# Patient Record
Sex: Male | Born: 1970 | Race: White | Hispanic: No | Marital: Single | Smoking: Never smoker
Health system: Southern US, Community
[De-identification: ages and names within clinical notes are randomized; demographics above are authoritative.]

## PROBLEM LIST (undated history)

## (undated) DIAGNOSIS — E785 Hyperlipidemia, unspecified: Secondary | ICD-10-CM

## (undated) DIAGNOSIS — I10 Essential (primary) hypertension: Secondary | ICD-10-CM

## (undated) DIAGNOSIS — J80 Acute respiratory distress syndrome: Secondary | ICD-10-CM

## (undated) HISTORY — PX: ELBOW SURGERY: SHX618

## (undated) HISTORY — PX: WRIST SURGERY: SHX841

---

## 2014-03-26 ENCOUNTER — Ambulatory Visit: Payer: Self-pay | Admitting: Physician Assistant

## 2015-01-13 ENCOUNTER — Ambulatory Visit
Admission: EM | Admit: 2015-01-13 | Discharge: 2015-01-13 | Disposition: A | Discharge status: Home / Self Care | Payer: BLUE CROSS/BLUE SHIELD | Attending: Family Medicine | Admitting: Family Medicine

## 2015-01-13 DIAGNOSIS — L03115 Cellulitis of right lower limb: Secondary | ICD-10-CM

## 2015-01-13 DIAGNOSIS — M10072 Idiopathic gout, left ankle and foot: Secondary | ICD-10-CM | POA: Diagnosis not present

## 2015-01-13 MED ORDER — SULFAMETHOXAZOLE-TRIMETHOPRIM 800-160 MG PO TABS: 1.0000 | ORAL_TABLET | Freq: Two times a day (BID) | ORAL | Status: DC

## 2015-01-13 MED ORDER — HYDROCODONE-ACETAMINOPHEN 5-325 MG PO TABS: 1.0000 | ORAL_TABLET | Freq: Four times a day (QID) | ORAL | Status: DC | PRN

## 2015-01-13 MED ORDER — PREDNISONE 20 MG PO TABS: ORAL_TABLET | ORAL | Status: DC

## 2015-01-13 NOTE — ED Notes (Signed)
Started in the middle of last night with pain left great toe. + redness and swelling, gout like in appearance. Also 3 tick bites right knee last week-now infected

## 2015-01-13 NOTE — ED Provider Notes (Addendum)
CSN: [id number]     Arrival date & time 01/13/15  1537 History   First MD Initiated Contact with Patient 01/13/15 1556     Chief Complaint  Patient presents with  . Toe Pain   (Consider location/radiation/quality/duration/timing/severity/associated sxs/prior Treatment) HPI Comments: 44 yo male with a c/o left big toe pain, redness and swelling that started last night and worse today. Denies any injuries, trauma, fevers, chills, drainage. Also complains of 3 tick bites on his right lower thigh. States the ticks were embedded less than 24 hours, were not engorged, and he pulled them out. The surrounding skin however has become red and slightly tender.   The history is provided by the patient.    History reviewed. No pertinent past medical history. History reviewed. No pertinent past surgical history. Family History  Problem Relation Age of Onset  . Parkinson's disease Mother   . Diabetes Father    History  Substance Use Topics  . Smoking status: Never Smoker   . Smokeless tobacco: Former NeurosurgeonUser    Types: Chew  . Alcohol Use: Yes     Comment: rarely    Review of Systems  Allergies  Review of patient's allergies indicates no known allergies.  Home Medications   Prior to Admission medications   Medication Sig Start Date End Date Taking? Authorizing Provider  HYDROcodone-acetaminophen (NORCO/VICODIN) 5-325 MG per tablet Take 1-2 tablets by mouth every 6 (six) hours as needed. 01/13/15   Payton Mccallumrlando Oakleigh Hesketh, MD  predniSONE (DELTASONE) 20 MG tablet Take 2 tablets po today, then 1 tab po qd for next 6 days 01/13/15   Payton Mccallumrlando Codie Hainer, MD  sulfamethoxazole-trimethoprim (BACTRIM DS,SEPTRA DS) 800-160 MG per tablet Take 1 tablet by mouth 2 (two) times daily. 01/13/15   Payton Mccallumrlando Mistina Coatney, MD   BP 151/111 mmHg  Pulse 100  Temp(Src) 97.5 F (36.4 C) (Oral)  Resp 18  Ht 6\' 1"  (1.854 m)  Wt 300 lb (136.079 kg)  BMI 39.59 kg/m2  SpO2 99% Physical Exam  Constitutional: He appears well-developed and  well-nourished. No distress.  Musculoskeletal: He exhibits edema and tenderness.  Left 1st MTP joint erythematous, swollen and tender; left lower ext neurovascularly intact  Skin: He is not diaphoretic. There is erythema.  3 discreet 2cm skin ulcerations with surrounding blanchable erythema and warmth on the right upper thigh;  no drainage  Nursing note and vitals reviewed.   ED Course  Procedures (including critical care time) Labs Review Labs Reviewed - No data to display  Imaging Review No results found.   MDM   1. Acute idiopathic gout of left foot   2. Cellulitis of right leg    Discharge Medication List as of 01/13/2015  4:14 PM    START taking these medications   Details  HYDROcodone-acetaminophen (NORCO/VICODIN) 5-325 MG per tablet Take 1-2 tablets by mouth every 6 (six) hours as needed., Starting 01/13/2015, Until Discontinued, Print    predniSONE (DELTASONE) 20 MG tablet Take 2 tablets po today, then 1 tab po qd for next 6 days, Normal    sulfamethoxazole-trimethoprim (BACTRIM DS,SEPTRA DS) 800-160 MG per tablet Take 1 tablet by mouth 2 (two) times daily., Starting 01/13/2015, Until Discontinued, Normal      Plan: 1. diagnosis reviewed with patient 2. rx as per orders; risks, benefits, potential side effects reviewed with patient 3. Recommend supportive treatment with rest, warm compresses 4. F/u prn if symptoms worsen or don't improve    Payton Mccallumrlando Jode Lippe, MD 01/13/15 1643  Payton Mccallumrlando Annel Zunker, MD 01/13/15 [phone]

## 2019-02-27 ENCOUNTER — Encounter: Payer: Self-pay | Admitting: Emergency Medicine

## 2019-02-27 ENCOUNTER — Ambulatory Visit (INDEPENDENT_AMBULATORY_CARE_PROVIDER_SITE_OTHER): Payer: BC Managed Care – PPO

## 2019-02-27 ENCOUNTER — Ambulatory Visit
Admission: EM | Admit: 2019-02-27 | Discharge: 2019-02-27 | Disposition: A | Discharge status: Home / Self Care | Payer: BC Managed Care – PPO

## 2019-02-27 ENCOUNTER — Other Ambulatory Visit: Payer: Self-pay

## 2019-02-27 DIAGNOSIS — M25571 Pain in right ankle and joints of right foot: Secondary | ICD-10-CM | POA: Diagnosis not present

## 2019-02-27 DIAGNOSIS — S93491A Sprain of other ligament of right ankle, initial encounter: Secondary | ICD-10-CM

## 2019-02-27 DIAGNOSIS — W108XXA Fall (on) (from) other stairs and steps, initial encounter: Secondary | ICD-10-CM

## 2019-02-27 HISTORY — DX: Acute respiratory distress syndrome: J80

## 2019-02-27 HISTORY — DX: Hyperlipidemia, unspecified: E78.5

## 2019-02-27 HISTORY — DX: Essential (primary) hypertension: I10

## 2019-02-27 MED ORDER — NAPROXEN 500 MG PO TABS: 500.0000 mg | ORAL_TABLET | Freq: Two times a day (BID) | ORAL | 0 refills | Status: AC | PRN

## 2019-02-27 NOTE — ED Notes (Signed)
Ace wrap applied to right ankle.

## 2019-02-27 NOTE — ED Provider Notes (Signed)
[facility]    CSN: [id number] Arrival date & time: 02/27/19  [id number]     History   Chief Complaint Chief Complaint  Patient presents with  . Fall  . Ankle Pain    right    HPI Jared Mathis is a 48 y.o. male.   48 year old male presents with right ankle pain and swelling. He was going down stairs into his garage last evening when he missed a step and fell down a few steps, rolling his ankle. He felt immediate pain and was unable to get up for over 30 minutes. Last night he was unable to climb up the stairs to the 2nd floor to go to bed. He applied ice/cold packs on the ankle and took Advil with minimal relief. Area now is very swollen with small hematoma present on lateral side of his right ankle. He is unable to bear weight. Denies any numbness or tingling. Has sprained his ankle before but "never this severe". Other chronic health issues include HTN, hyperlipidemia, and obesity and currently on Lisinopril, Lipitor, aspirin, Prozac daily and Trazodone prn.   The history is provided by the patient.    Past Medical History:  Diagnosis Date  . ARDS (adult respiratory distress syndrome) (HCC)   . Hyperlipidemia   . Hypertension     There are no active problems to display for this patient.   Past Surgical History:  Procedure Laterality Date  . ELBOW SURGERY Left   . WRIST SURGERY Left        Home Medications    Prior to Admission medications   Medication Sig Start Date End Date Taking? Authorizing Provider  ASPIRIN LOW DOSE 81 MG EC tablet Take 81 mg by mouth daily. 02/12/19  Yes [provider]  atorvastatin (LIPITOR) 10 MG tablet Take 10 mg by mouth daily. 11/09/18  Yes [provider]  FLUoxetine (PROZAC) 20 MG capsule  02/18/19  Yes [provider]  lisinopril (ZESTRIL) 10 MG tablet  02/18/19  Yes [provider]  traZODone (DESYREL) 50 MG tablet TAKE ONE TO TWO TABS DAILY AS NEEDED FOR SLEEP OR ANXIETY SYMPTOMS. 02/12/19   Yes [provider]  naproxen (NAPROSYN) 500 MG tablet Take 1 tablet (500 mg total) by mouth every 12 (twelve) hours as needed for moderate pain. 02/27/19   Sudie GrumblingAmyot, Carnell Casamento Berry, NP    Family History Family History  Problem Relation Age of Onset  . Parkinson's disease Mother   . Diabetes Father     Social History Social History   Tobacco Use  . Smoking status: Never Smoker  . Smokeless tobacco: Former NeurosurgeonUser    Types: Chew  Substance Use Topics  . Alcohol use: Yes    Comment: rarely  . Drug use: Not on file     Allergies   Patient has no known allergies.   Review of Systems Review of Systems  Constitutional: Negative for activity change, appetite change, chills, fatigue and fever.  Respiratory: Negative for cough, chest tightness, shortness of breath and wheezing.   Cardiovascular: Negative for chest pain and leg swelling.  Musculoskeletal: Positive for arthralgias, gait problem, joint swelling and myalgias.  Skin: Positive for color change. Negative for rash and wound.  Allergic/Immunologic: Negative for immunocompromised state.  Neurological: Negative for dizziness, tremors, seizures, syncope, weakness, light-headedness, numbness and headaches.  Hematological: Negative for adenopathy. Does not bruise/bleed easily.  Psychiatric/Behavioral: Positive for sleep disturbance.     Physical Exam Triage Vital Signs ED Triage  Vitals  Enc Vitals Group     BP 02/27/19 0859 118/83     Pulse Rate 02/27/19 0859 79     Resp 02/27/19 0859 18     Temp 02/27/19 0859 98.3 F (36.8 C)     Temp Source 02/27/19 0859 Oral     SpO2 02/27/19 0859 95 %     Weight 02/27/19 0854 (!) 348 lb (157.9 kg)     Height 02/27/19 0854 6\' 1"  (1.854 m)     Head Circumference --      Peak Flow --      Pain Score 02/27/19 0854 10     Pain Loc --      Pain Edu? --      Excl. in [place]? --    No data found.  Updated Vital Signs BP 118/83 (BP Location: Left Arm)   Pulse 79   Temp 98.3 F (36.8  C) (Oral)   Resp 18   Ht 6\' 1"  (1.854 m)   Wt (!) 348 lb (157.9 kg)   SpO2 95%   BMI 45.91 kg/m   Visual Acuity Right Eye Distance:   Left Eye Distance:   Bilateral Distance:    Right Eye Near:   Left Eye Near:    Bilateral Near:     Physical Exam Vitals signs and nursing note reviewed.  Constitutional:      General: He is awake. He is not in acute distress.    Appearance: He is well-developed and well-groomed. He is obese. He is not ill-appearing.     Comments: Patient sitting in wheel chair in no acute distress but appears in pain.   HENT:     Head: Normocephalic and atraumatic.  Eyes:     Extraocular Movements: Extraocular movements intact.     Conjunctiva/sclera: Conjunctivae normal.  Neck:     Musculoskeletal: Normal range of motion.  Cardiovascular:     Rate and Rhythm: Normal rate.  Pulmonary:     Effort: Pulmonary effort is normal.  Musculoskeletal:        General: Swelling, tenderness and signs of injury present.     Right ankle: He exhibits swelling and ecchymosis. He exhibits normal range of motion and normal pulse. Tenderness. Lateral malleolus tenderness found. Achilles tendon normal.       Feet:     Comments: Decreased range of motion of right ankle- particularly with extension and rotation. Very swollen and tender around the entire lateral malleolus. 1 cm small purple hematoma present at surface of lateral malleolus. Some bruising present more posterior. Swelling extends to proximal dorsal area of right foot. Good pulses and capillary refill. No neuro deficits noted. No open wound.  Skin:    General: Skin is warm and dry.     Capillary Refill: Capillary refill takes less than 2 seconds.     Findings: No erythema or rash.  Neurological:     General: No focal deficit present.     Mental Status: He is alert and oriented to person, place, and time.     Sensory: Sensation is intact. No sensory deficit.     Motor: Motor function is intact.     Gait: Gait  normal.     Comments: Unable to bear weight on right foot/ankle  Psychiatric:        Mood and Affect: Mood normal.        Behavior: Behavior normal. Behavior is cooperative.        Thought Content: Thought content normal.  Judgment: Judgment normal.      UC Treatments / Results  Labs (all labs ordered are listed, but only abnormal results are displayed) Labs Reviewed - No data to display  EKG   Radiology Dg Ankle Complete Right  Result Date: 02/27/2019 CLINICAL DATA:  Fall on stairs with ankle pain and swelling, initial encounter EXAM: RIGHT ANKLE - COMPLETE 3+ VIEW COMPARISON:  None. FINDINGS: Generalized soft tissue swelling is noted with a small focal hematoma noted adjacent to the distal fibular metaphysis. No acute fracture or dislocation is noted. Calcaneal spurs are seen. IMPRESSION: Considerable soft tissue swelling and small hematoma. No fracture is seen. Electronically Signed   By: Alcide CleverMark  Lukens M.D.   On: 02/27/2019 09:38    Procedures Procedures (including critical care time)  Medications Ordered in UC Medications - No data to display  Initial Impression / Assessment and Plan / UC Course  I have reviewed the triage vital signs and the nursing notes.  Pertinent labs & imaging results that were available during my care of the patient were reviewed by me and considered in my medical decision making (see chart for details).    Reviewed x-ray results with patient- no distinct fracture seen. Discussed hematoma present at skin surface and below. Should slowly resolve on own but continue to monitor. Recommend wear ace wrap and placed on walking boot for support. Use crutches as needed. Keep foot elevated as much as possible to help with swelling. May continue to apply ice to area as needed. May take Naproxen 500mg  twice a day as needed for pain. Follow-up with Dr. Genevieve NorlanderFowler/[facility] if right ankle pain does not start improving in 3 to 5 days or go to the ER  ASAP if right ankle pain worsens significantly with increased swelling or any numbness develops.   Final Clinical Impressions(s) / UC Diagnoses   Final diagnoses:  Acute right ankle pain  Sprain of other ligament of right ankle, initial encounter     Discharge Instructions     Recommend wear ace wrap and right foot boot for support. Keep foot elevated as much as possible. Continue to apply ice as needed today. May take Naproxen 500mg  twice a day as needed for pain. Use crutches as needed for support. Follow-up with Dr. Ether GriffinsFowler if right ankle pain does not start improving in 3 to 5 days or Go to the ER if ankle pain worsens or any numbness or significantly increased swelling occurs.     ED Prescriptions    Medication Sig Dispense Auth. Provider   naproxen (NAPROSYN) 500 MG tablet Take 1 tablet (500 mg total) by mouth every 12 (twelve) hours as needed for moderate pain. 20 tablet Sudie GrumblingAmyot, Chinara Hertzberg Berry, NP     Controlled Substance Prescriptions [place] Controlled Substance Registry consulted? Not Applicable   Sudie Grumblingmyot, Cheikh Bramble Berry, NP 02/27/19 1316

## 2019-02-27 NOTE — Discharge Instructions (Addendum)
Recommend wear ace wrap and right foot boot for support. Keep foot elevated as much as possible. Continue to apply ice as needed today. May take Naproxen 500mg  twice a day as needed for pain. Use crutches as needed for support. Follow-up with Dr. Vickki Muff if right ankle pain does not start improving in 3 to 5 days or Go to the ER if ankle pain worsens or any numbness or significantly increased swelling occurs.

## 2019-02-27 NOTE — ED Triage Notes (Signed)
Pt c/o right ankle pain. She states that he fell trying to get up the stairs last night. Ankle is swollen and bruised.

## 2020-02-18 IMAGING — CR RIGHT ANKLE - COMPLETE 3+ VIEW
3 series · 3 of 3 positions shown · non-contrast
Comparison: None.

CLINICAL DATA: Fall on stairs with ankle pain and swelling, initial
encounter

EXAM:
RIGHT ANKLE - COMPLETE 3+ VIEW

[ankle ap]
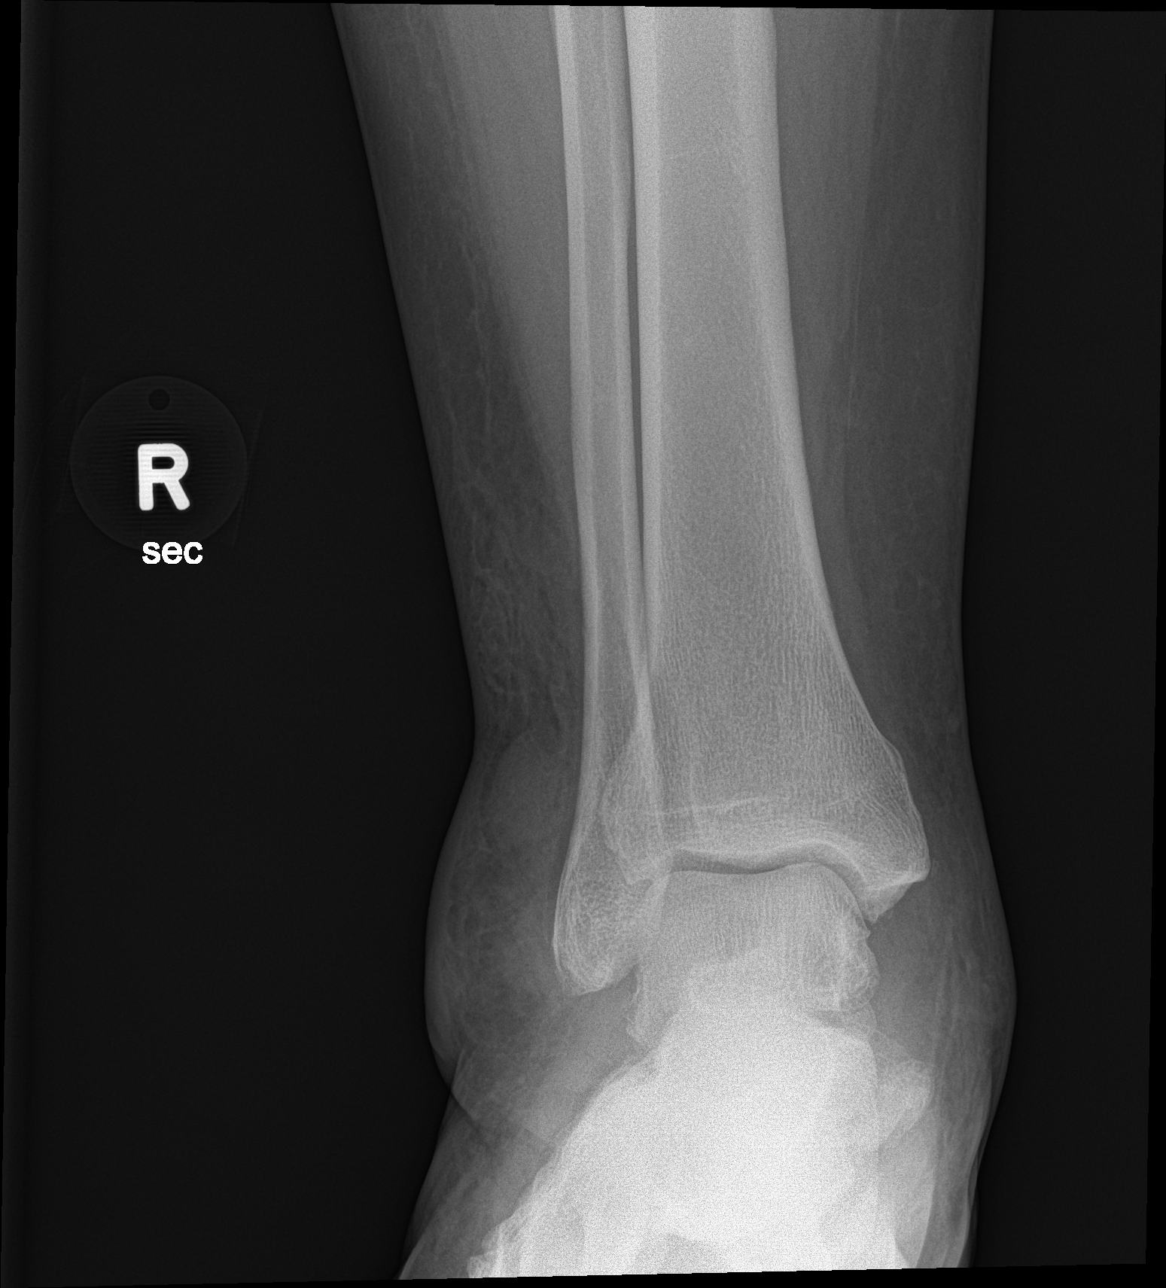

[ankle obl]
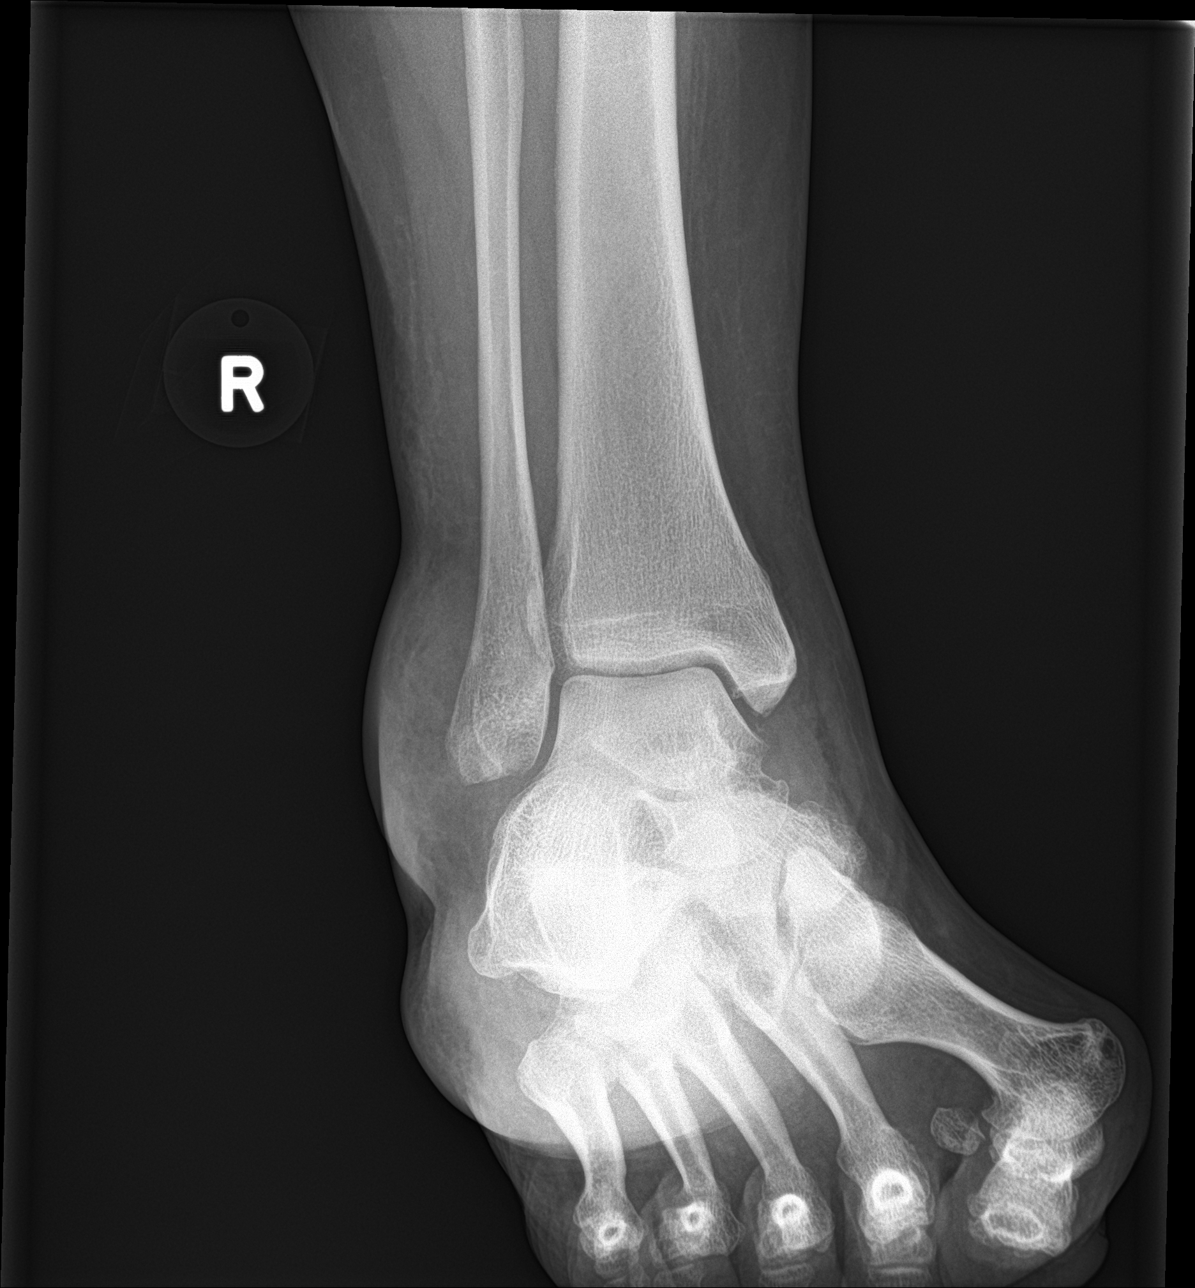

[ankle lat]
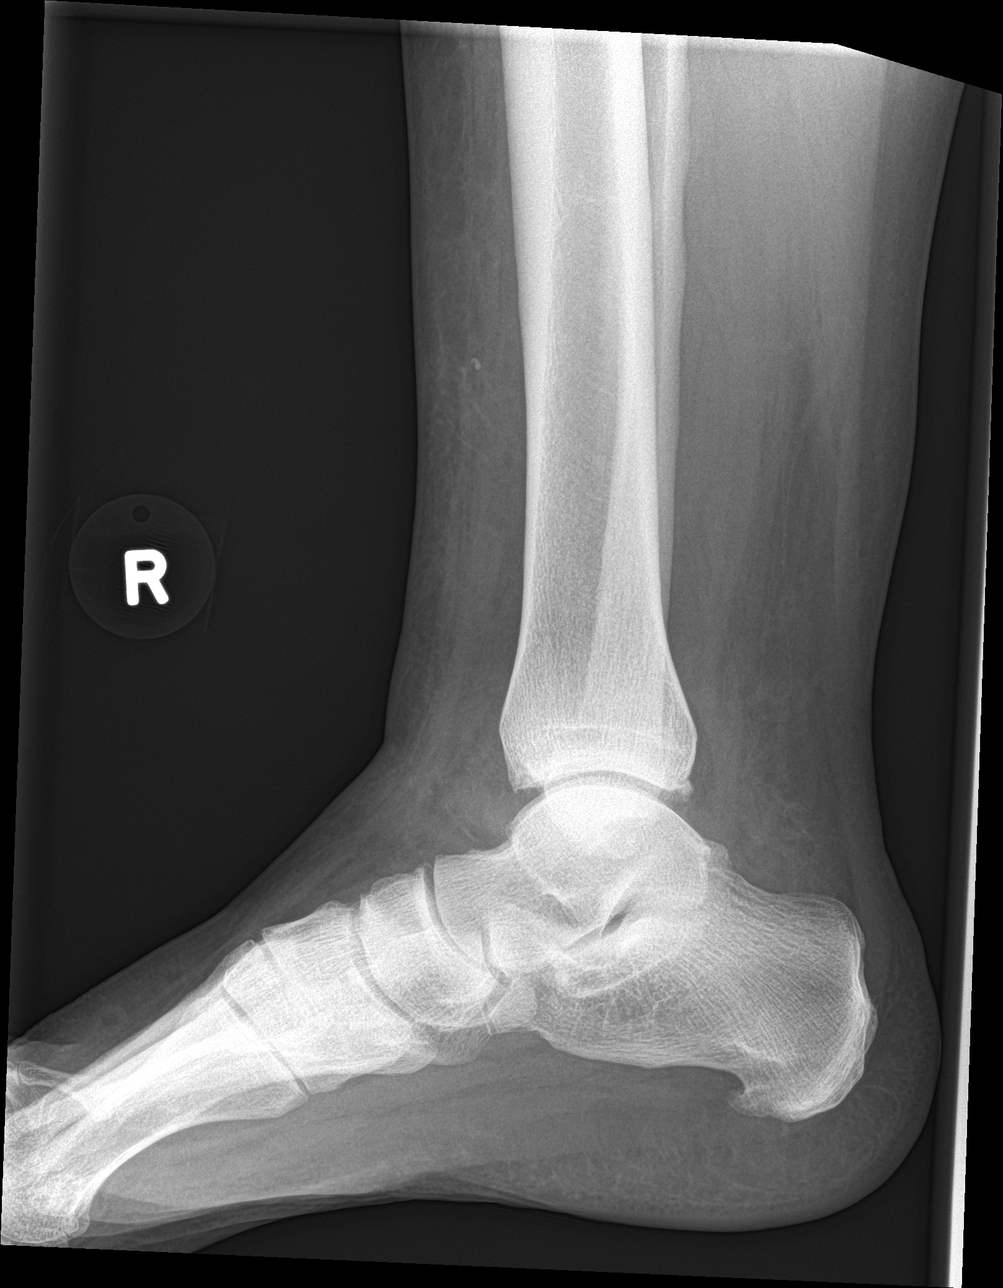

[3 of 3 positions shown; findings below may reference images not displayed]

FINDINGS: Generalized soft tissue swelling is noted with a small focal
hematoma noted adjacent to the distal fibular metaphysis. No acute
fracture or dislocation is noted. Calcaneal spurs are seen.
IMPRESSION: Considerable soft tissue swelling and small hematoma. No fracture is
seen.
